# Patient Record
Sex: Male | Born: 1970 | Race: Black or African American | Hispanic: No | Marital: Single | Smoking: Current some day smoker
Health system: Southern US, Community
[De-identification: ages and names within clinical notes are randomized; demographics above are authoritative.]

---

## 2014-07-18 ENCOUNTER — Emergency Department (HOSPITAL_COMMUNITY)
Admission: EM | Admit: 2014-07-18 | Discharge: 2014-07-18 | Disposition: A | Discharge status: Home / Self Care | Attending: Emergency Medicine | Admitting: Emergency Medicine

## 2014-07-18 ENCOUNTER — Encounter (HOSPITAL_COMMUNITY): Payer: Self-pay | Admitting: Emergency Medicine

## 2014-07-18 DIAGNOSIS — Z0000 Encounter for general adult medical examination without abnormal findings: Secondary | ICD-10-CM | POA: Diagnosis present

## 2014-07-18 DIAGNOSIS — IMO0001 Reserved for inherently not codable concepts without codable children: Secondary | ICD-10-CM

## 2014-07-18 NOTE — ED Provider Notes (Signed)
CSN: [id number]     Arrival date & time 07/18/14  2045 History  This chart was scribed for Gilda Crease, MD by Modena Jansky, ED Scribe. This patient was seen in room [facility] and the patient's care was started at 8:57 PM.   No chief complaint on file.  Pt is brought in from prison for drug screening for suspicion of use and/or overdose of K-2 The history is provided by the patient and the EMS personnel. No language interpreter was used.   HPI Comments: Curtis Terry is a 44 y.o. male who presents to the Emergency Department complaining of a need for a drug screening. He reports that he was brought here from prison and is unsure of the reason why. He reports that he feels fine and denies any symptoms.   No past medical history on file. No past surgical history on file. No family history on file. History  Substance Use Topics  . Smoking status: Not on file  . Smokeless tobacco: Not on file  . Alcohol Use: Not on file    Review of Systems  All other systems reviewed and are negative.   Allergies  Review of patient's allergies indicates not on file.  Home Medications   Prior to Admission medications   Not on File   BP 133/104 mmHg  Pulse 55  Temp(Src) 98.7 F (37.1 C) (Oral)  Resp 20  Ht 6' (1.829 m)  Wt 205 lb (92.987 kg)  BMI 27.80 kg/m2  SpO2 100% Physical Exam  Constitutional: He is oriented to person, place, and time. He appears well-developed and well-nourished. No distress.  HENT:  Head: Normocephalic and atraumatic.  Right Ear: Hearing normal.  Left Ear: Hearing normal.  Nose: Nose normal.  Mouth/Throat: Oropharynx is clear and moist and mucous membranes are normal.  Eyes: Conjunctivae and EOM are normal. Pupils are equal, round, and reactive to light.  Neck: Normal range of motion. Neck supple.  Cardiovascular: Regular rhythm, S1 normal and S2 normal.  Exam reveals no gallop and no friction rub.   No murmur heard. Pulmonary/Chest: Effort  normal and breath sounds normal. No respiratory distress. He exhibits no tenderness.  Abdominal: Soft. Normal appearance and bowel sounds are normal. There is no hepatosplenomegaly. There is no tenderness. There is no rebound, no guarding, no tenderness at McBurney's point and negative Murphy's sign. No hernia.  Musculoskeletal: Normal range of motion.  Neurological: He is alert and oriented to person, place, and time. He has normal strength. No cranial nerve deficit or sensory deficit. Coordination normal. GCS eye subscore is 4. GCS verbal subscore is 5. GCS motor subscore is 6.  Skin: Skin is warm, dry and intact. No rash noted. No cyanosis.  Psychiatric: He has a normal mood and affect. His speech is normal and behavior is normal. Thought content normal.  Nursing note and vitals reviewed.   ED Course  Procedures (including critical care time) DIAGNOSTIC STUDIES: Oxygen Saturation is 100% on RA, normal by my interpretation.    COORDINATION OF CARE: 9:01 PM- Pt advised of plan for treatment and pt agrees.  Labs Review Labs Reviewed - No data to display  Imaging Review No results found.   EKG Interpretation None      MDM   Final diagnoses:  None   Patient is one of 5 patient is brought to the emergency department tonight from jail for suspicion of K to use. Medical personnel and corrections were requesting testing for K2. There is no test for  K2.  Patient is without complaints, no workup is necessary.   I personally performed the services described in this documentation, which was scribed in my presence. The recorded information has been reviewed and is accurate.      Gilda Creasehristopher J Lysha Schrade, MD 07/18/14 2132
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
## 2014-09-01 ENCOUNTER — Encounter (HOSPITAL_COMMUNITY): Payer: Self-pay | Admitting: *Deleted

## 2014-09-01 ENCOUNTER — Emergency Department (HOSPITAL_COMMUNITY)

## 2014-09-01 ENCOUNTER — Emergency Department (HOSPITAL_COMMUNITY)
Admission: EM | Admit: 2014-09-01 | Discharge: 2014-09-01 | Disposition: A | Discharge status: Home / Self Care | Attending: Emergency Medicine | Admitting: Emergency Medicine

## 2014-09-01 DIAGNOSIS — Y998 Other external cause status: Secondary | ICD-10-CM | POA: Insufficient documentation

## 2014-09-01 DIAGNOSIS — Y92310 Basketball court as the place of occurrence of the external cause: Secondary | ICD-10-CM | POA: Diagnosis not present

## 2014-09-01 DIAGNOSIS — X58XXXA Exposure to other specified factors, initial encounter: Secondary | ICD-10-CM | POA: Diagnosis not present

## 2014-09-01 DIAGNOSIS — S99912A Unspecified injury of left ankle, initial encounter: Secondary | ICD-10-CM | POA: Diagnosis present

## 2014-09-01 DIAGNOSIS — Z720 Tobacco use: Secondary | ICD-10-CM | POA: Insufficient documentation

## 2014-09-01 DIAGNOSIS — S93402A Sprain of unspecified ligament of left ankle, initial encounter: Secondary | ICD-10-CM | POA: Insufficient documentation

## 2014-09-01 DIAGNOSIS — Y9367 Activity, basketball: Secondary | ICD-10-CM | POA: Insufficient documentation

## 2014-09-01 MED ORDER — IBUPROFEN 800 MG PO TABS: 800.0000 mg | ORAL_TABLET | Freq: Three times a day (TID) | ORAL | Status: AC

## 2014-09-01 MED ORDER — IBUPROFEN 800 MG PO TABS
800.0000 mg | ORAL_TABLET | Freq: Once | ORAL | Status: AC
  Administered 2014-09-01: 800 mg via ORAL
  Filled 2014-09-01: qty 1

## 2014-09-01 MED ORDER — HYDROCODONE-ACETAMINOPHEN 5-325 MG PO TABS
1.0000 | ORAL_TABLET | Freq: Once | ORAL | Status: AC
  Administered 2014-09-01: 1 via ORAL
  Filled 2014-09-01: qty 1

## 2014-09-01 NOTE — Discharge Instructions (Signed)
Ankle Sprain  An ankle sprain is an injury to the strong, fibrous tissues (ligaments) that hold your ankle bones together.   HOME CARE   · Put ice on your ankle for 1-2 days or as told by your doctor.  ¨ Put ice in a plastic bag.  ¨ Place a towel between your skin and the bag.  ¨ Leave the ice on for 15-20 minutes at a time, every 2 hours while you are awake.  · Only take medicine as told by your doctor.  · Raise (elevate) your injured ankle above the level of your heart as much as possible for 2-3 days.  · Use crutches if your doctor tells you to. Slowly put your own weight on the affected ankle. Use the crutches until you can walk without pain.  · If you have a plaster splint:  ¨ Do not rest it on anything harder than a pillow for 24 hours.  ¨ Do not put weight on it.  ¨ Do not get it wet.  ¨ Take it off to shower or bathe.  · If given, use an elastic wrap or support stocking for support. Take the wrap off if your toes lose feeling (numb), tingle, or turn cold or blue.  · If you have an air splint:  ¨ Add or let out air to make it comfortable.  ¨ Take it off at night and to shower and bathe.  ¨ Wiggle your toes and move your ankle up and down often while you are wearing it.  GET HELP IF:  · You have rapidly increasing bruising or puffiness (swelling).  · Your toes feel very cold.  · You lose feeling in your foot.  · Your medicine does not help your pain.  GET HELP RIGHT AWAY IF:   · Your toes lose feeling (numb) or turn blue.  · You have severe pain that is increasing.  MAKE SURE YOU:   · Understand these instructions.  · Will watch your condition.  · Will get help right away if you are not doing well or get worse.  Document Released: 09/13/2007 Document Revised: 08/11/2013 Document Reviewed: 10/09/2011  [employer]® Patient Information ©2015 [employer]. This information is not intended to replace advice given to you by your health care provider. Make sure you discuss any questions you have with your health care  provider.

## 2014-09-01 NOTE — ED Notes (Signed)
Officer with pt advises that he has access to crutches at the facility,

## 2014-09-01 NOTE — ED Notes (Signed)
Report given to [facility] at correction facility,

## 2014-09-01 NOTE — ED Notes (Signed)
Pt states that he was playing basketball today and twisted left ankle, obvious swelling, deformity noted to left ankle, cms intact distal, ice pack applied,

## 2014-09-01 NOTE — ED Notes (Signed)
Pt with left ankle swelling and pain after rolling ankle and hitting corner of cement today while playing basketball, also pain to hamstring of left leg
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
## 2016-03-28 ENCOUNTER — Emergency Department (HOSPITAL_BASED_OUTPATIENT_CLINIC_OR_DEPARTMENT_OTHER): Payer: No Typology Code available for payment source

## 2016-03-28 ENCOUNTER — Emergency Department (HOSPITAL_BASED_OUTPATIENT_CLINIC_OR_DEPARTMENT_OTHER)
Admission: EM | Admit: 2016-03-28 | Discharge: 2016-03-28 | Disposition: A | Discharge status: Home / Self Care | Payer: No Typology Code available for payment source | Attending: Emergency Medicine | Admitting: Emergency Medicine

## 2016-03-28 ENCOUNTER — Encounter (HOSPITAL_BASED_OUTPATIENT_CLINIC_OR_DEPARTMENT_OTHER): Payer: Self-pay | Admitting: Emergency Medicine

## 2016-03-28 DIAGNOSIS — Y999 Unspecified external cause status: Secondary | ICD-10-CM | POA: Insufficient documentation

## 2016-03-28 DIAGNOSIS — S161XXA Strain of muscle, fascia and tendon at neck level, initial encounter: Secondary | ICD-10-CM | POA: Diagnosis not present

## 2016-03-28 DIAGNOSIS — Y939 Activity, unspecified: Secondary | ICD-10-CM | POA: Diagnosis not present

## 2016-03-28 DIAGNOSIS — S39012A Strain of muscle, fascia and tendon of lower back, initial encounter: Secondary | ICD-10-CM | POA: Diagnosis not present

## 2016-03-28 DIAGNOSIS — Y92410 Unspecified street and highway as the place of occurrence of the external cause: Secondary | ICD-10-CM | POA: Diagnosis not present

## 2016-03-28 DIAGNOSIS — S199XXA Unspecified injury of neck, initial encounter: Secondary | ICD-10-CM | POA: Diagnosis present

## 2016-03-28 MED ORDER — NAPROXEN 250 MG PO TABS
500.0000 mg | ORAL_TABLET | Freq: Once | ORAL | Status: AC
  Administered 2016-03-28: 500 mg via ORAL
  Filled 2016-03-28: qty 2

## 2016-03-28 MED ORDER — METHOCARBAMOL 500 MG PO TABS: 500.0000 mg | ORAL_TABLET | Freq: Two times a day (BID) | ORAL | 0 refills | Status: AC

## 2016-03-28 MED ORDER — METHOCARBAMOL 500 MG PO TABS
500.0000 mg | ORAL_TABLET | Freq: Once | ORAL | Status: AC
  Administered 2016-03-28: 500 mg via ORAL
  Filled 2016-03-28: qty 1

## 2016-03-28 MED ORDER — NAPROXEN 500 MG PO TABS: 500.0000 mg | ORAL_TABLET | Freq: Two times a day (BID) | ORAL | 0 refills | Status: AC
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
